# Patient Record
Sex: Female | Born: 1955 | Hispanic: Yes | Marital: Single | Smoking: Never smoker
Health system: Southern US, Community
[De-identification: ages and names within clinical notes are randomized; demographics above are authoritative.]

## PROBLEM LIST (undated history)

## (undated) DIAGNOSIS — I447 Left bundle-branch block, unspecified: Secondary | ICD-10-CM

## (undated) DIAGNOSIS — M810 Age-related osteoporosis without current pathological fracture: Secondary | ICD-10-CM

## (undated) DIAGNOSIS — E785 Hyperlipidemia, unspecified: Secondary | ICD-10-CM

## (undated) DIAGNOSIS — I10 Essential (primary) hypertension: Secondary | ICD-10-CM

## (undated) DIAGNOSIS — G4733 Obstructive sleep apnea (adult) (pediatric): Secondary | ICD-10-CM

## (undated) DIAGNOSIS — R7303 Prediabetes: Secondary | ICD-10-CM

## (undated) HISTORY — DX: Left bundle-branch block, unspecified: I44.7

## (undated) HISTORY — PX: COLONOSCOPY: SHX174

## (undated) HISTORY — DX: Obstructive sleep apnea (adult) (pediatric): G47.33

## (undated) HISTORY — PX: OTHER SURGICAL HISTORY: SHX169

## (undated) HISTORY — DX: Age-related osteoporosis without current pathological fracture: M81.0

---

## 2005-12-13 ENCOUNTER — Ambulatory Visit: Payer: Self-pay

## 2014-04-13 ENCOUNTER — Ambulatory Visit: Payer: Self-pay

## 2014-05-29 ENCOUNTER — Ambulatory Visit: Payer: Self-pay | Admitting: Gastroenterology

## 2014-06-01 LAB — PATHOLOGY REPORT

## 2015-01-08 ENCOUNTER — Emergency Department: Payer: Self-pay | Admitting: Emergency Medicine
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
## 2016-11-21 ENCOUNTER — Other Ambulatory Visit: Payer: Self-pay | Admitting: Internal Medicine

## 2016-11-21 DIAGNOSIS — Z1231 Encounter for screening mammogram for malignant neoplasm of breast: Secondary | ICD-10-CM

## 2016-12-20 ENCOUNTER — Ambulatory Visit
Admission: RE | Admit: 2016-12-20 | Discharge: 2016-12-20 | Disposition: A | Discharge status: Home / Self Care | Payer: BLUE CROSS/BLUE SHIELD | Source: Ambulatory Visit | Attending: Internal Medicine | Admitting: Internal Medicine

## 2016-12-20 ENCOUNTER — Encounter: Payer: Self-pay | Admitting: Radiology

## 2016-12-20 DIAGNOSIS — Z1231 Encounter for screening mammogram for malignant neoplasm of breast: Secondary | ICD-10-CM

## 2017-01-23 ENCOUNTER — Encounter: Payer: Self-pay | Admitting: Emergency Medicine

## 2017-01-23 ENCOUNTER — Emergency Department
Admission: EM | Admit: 2017-01-23 | Discharge: 2017-01-24 | Disposition: A | Discharge status: Home / Self Care | Payer: BLUE CROSS/BLUE SHIELD | Attending: Emergency Medicine | Admitting: Emergency Medicine

## 2017-01-23 DIAGNOSIS — J029 Acute pharyngitis, unspecified: Secondary | ICD-10-CM | POA: Diagnosis present

## 2017-01-23 DIAGNOSIS — I10 Essential (primary) hypertension: Secondary | ICD-10-CM | POA: Insufficient documentation

## 2017-01-23 DIAGNOSIS — J0100 Acute maxillary sinusitis, unspecified: Secondary | ICD-10-CM | POA: Insufficient documentation

## 2017-01-23 HISTORY — DX: Essential (primary) hypertension: I10

## 2017-01-24 LAB — POCT RAPID STREP A: Streptococcus, Group A Screen (Direct): NEGATIVE

## 2017-01-24 MED ORDER — AZITHROMYCIN 250 MG PO TABS: ORAL_TABLET | ORAL | 0 refills | Status: AC

## 2017-01-24 MED ORDER — AZITHROMYCIN 500 MG PO TABS
500.0000 mg | ORAL_TABLET | Freq: Once | ORAL | Status: AC
  Administered 2017-01-24: 500 mg via ORAL
  Filled 2017-01-24: qty 1

## 2017-01-24 MED ORDER — IBUPROFEN 800 MG PO TABS
800.0000 mg | ORAL_TABLET | Freq: Once | ORAL | Status: AC
  Administered 2017-01-24: 800 mg via ORAL
  Filled 2017-01-24: qty 1

## 2017-01-24 NOTE — ED Provider Notes (Signed)
[facility] Emergency Department Provider Note    First MD Initiated Contact with Patient 01/24/17 0011     (approximate)  I have reviewed the triage vital signs and the nursing notes.   HISTORY  Chief Complaint Sore Throat    HPI Carmen Singh is a 61 y.o. female with history of hypertension presents to the emergency department 1 day history of sore throat nasal congestion and subjective fevers. Patient admits to nonproductive cough.   Past Medical History:  Diagnosis Date  . Hypertension     There are no active problems to display for this patient.   History reviewed. No pertinent surgical history.  Prior to Admission medications   Medication Sig Start Date End Date Taking? Authorizing Provider  azithromycin (ZITHROMAX Z-PAK) 250 MG tablet Take 2 tablets (500 mg) on  Day 1,  followed by 1 tablet (250 mg) once daily on Days 2 through 5. 01/24/17 01/29/17  Darci Current, MD    Allergies Patient has no known allergies.  Family History  Problem Relation Age of Onset  . Uterine cancer Mother     Social History Social History  Substance Use Topics  . Smoking status: Never Smoker  . Smokeless tobacco: Never Used  . Alcohol use No    Review of Systems Constitutional: Positive for fever/chills Eyes: No visual changes. ENT: No sore throat.Positive for nasal congestion Cardiovascular: Denies chest pain. Respiratory: Denies shortness of breath. Positive for nonproductive cough Gastrointestinal: No abdominal pain.  No nausea, no vomiting.  No diarrhea.  No constipation. Genitourinary: Negative for dysuria. Musculoskeletal: Negative for back pain. Skin: Negative for rash. Neurological: Negative for headaches, focal weakness or numbness.  10-point ROS otherwise negative.  ____________________________________________   PHYSICAL EXAM:  VITAL SIGNS: ED Triage Vitals [01/23/17 2057]  Enc Vitals Group     BP (!) 169/86   Pulse Rate 98     Resp 18     Temp 98.6 F (37 C)     Temp Source Oral     SpO2 99 %     Weight 135 lb (61.2 kg)     Height 5' (1.524 m)     Head Circumference      Peak Flow      Pain Score 5     Pain Loc      Pain Edu?      Excl. in GC?     Constitutional: Alert and oriented. Well appearing and in no acute distress. Eyes: Conjunctivae are normal. PERRL. EOMI. Head: Atraumatic. Ears:  Healthy appearing ear canals and TM fullness clear fluid Nose: No congestion/rhinnorhea. Pain with maxillary sinus palpation left worse than right Mouth/Throat: Mucous membranes are moist.  Oropharynx non-erythematous. Neck: No stridor.  No meningeal signs. Cardiovascular: Normal rate, regular rhythm. Good peripheral circulation. Grossly normal heart sounds. Respiratory: Normal respiratory effort.  No retractions. Lungs CTAB. Gastrointestinal: Soft and nontender. No distention.  Musculoskeletal: No lower extremity tenderness nor edema. No gross deformities of extremities. Neurologic:  Normal speech and language. No gross focal neurologic deficits are appreciated.  Skin:  Skin is warm, dry and intact. No rash noted.   ____________________________________________   LABS (all labs ordered are listed, but only abnormal results are displayed)  Labs Reviewed  CULTURE, GROUP A STREP [facility])  POCT RAPID STREP A       Procedures   ____________________________________________   INITIAL IMPRESSION / ASSESSMENT AND PLAN / ED COURSE  Pertinent labs & imaging results that were available  during my care of the patient were reviewed by me and considered in my medical decision making (see chart for details).  History physical exam concern for possible sinusitis as such patient given azithromycin.      ____________________________________________  FINAL CLINICAL IMPRESSION(S) / ED DIAGNOSES  Final diagnoses:  Acute non-recurrent maxillary sinusitis     MEDICATIONS GIVEN DURING THIS  VISIT:  Medications  azithromycin (ZITHROMAX) tablet 500 mg (500 mg Oral Given 01/24/17 0026)  ibuprofen (ADVIL,MOTRIN) tablet 800 mg (800 mg Oral Given 01/24/17 0026)     NEW OUTPATIENT MEDICATIONS STARTED DURING THIS VISIT:  New Prescriptions   AZITHROMYCIN (ZITHROMAX Z-PAK) 250 MG TABLET    Take 2 tablets (500 mg) on  Day 1,  followed by 1 tablet (250 mg) once daily on Days 2 through 5.    Modified Medications   No medications on file    Discontinued Medications   No medications on file     Note:  This document was prepared using Dragon voice recognition software and may include unintentional dictation errors.    Darci Currentandolph N Brown, MD 01/24/17 [phone]

## 2017-01-26 LAB — CULTURE, GROUP A STREP (THRC)

## 2019-03-28 ENCOUNTER — Telehealth: Payer: Self-pay

## 2019-03-28 NOTE — Telephone Encounter (Signed)
Patient called our triage line and left a message to call her back. I have called her back twice and her voicemail is not set-up yet. Therefore, I have not been able to leave her a voicemail. Hopefully patient calls back.  I saw that patient was a self-referral to be seen at [facility] on 03/31/2019 but appointment was cancelled.

## 2019-03-28 NOTE — Telephone Encounter (Signed)
Sheena sent me a message letting me know that they are not seeing [facility] patients at this time due to COVID-19. They are only seeing Diagnostic patients at this time.

## 2019-03-31 ENCOUNTER — Ambulatory Visit: Payer: BLUE CROSS/BLUE SHIELD

## 2019-05-15 ENCOUNTER — Other Ambulatory Visit: Payer: Self-pay

## 2019-05-15 DIAGNOSIS — Z20822 Contact with and (suspected) exposure to covid-19: Secondary | ICD-10-CM

## 2019-05-19 LAB — NOVEL CORONAVIRUS, NAA: SARS-CoV-2, NAA: DETECTED — AB

## 2022-01-30 ENCOUNTER — Other Ambulatory Visit: Payer: Self-pay | Admitting: Family Medicine

## 2022-01-30 DIAGNOSIS — Z1231 Encounter for screening mammogram for malignant neoplasm of breast: Secondary | ICD-10-CM

## 2022-03-07 ENCOUNTER — Ambulatory Visit
Admission: RE | Admit: 2022-03-07 | Discharge: 2022-03-07 | Disposition: A | Discharge status: Home / Self Care | Payer: Commercial Managed Care - PPO | Source: Ambulatory Visit | Attending: Family Medicine | Admitting: Family Medicine

## 2022-03-07 DIAGNOSIS — Z1231 Encounter for screening mammogram for malignant neoplasm of breast: Secondary | ICD-10-CM | POA: Insufficient documentation

## 2022-06-12 ENCOUNTER — Ambulatory Visit (INDEPENDENT_AMBULATORY_CARE_PROVIDER_SITE_OTHER): Payer: BLUE CROSS/BLUE SHIELD | Admitting: Internal Medicine

## 2022-06-12 VITALS — BP 149/81 | HR 57 | Resp 14 | Ht 61.0 in | Wt 133.4 lb

## 2022-06-12 DIAGNOSIS — I10 Essential (primary) hypertension: Secondary | ICD-10-CM | POA: Diagnosis not present

## 2022-06-12 DIAGNOSIS — G4733 Obstructive sleep apnea (adult) (pediatric): Secondary | ICD-10-CM | POA: Diagnosis not present

## 2022-06-12 NOTE — Progress Notes (Signed)
Sleep Medicine   Office Visit  Patient Name: Carmen Singh DOB: 1955-12-20 MRN [id number]    Chief Complaint: sleep evaluation  Brief History:  Miroslava presents for an initial consult for sleep evaluation and to establish care. Sleep quality is poor. This is noted most nights. The patient's bed partner reports loud snoring and coughing at night. The patient relates the following symptoms: loud snoring, excessive daytime sleepiness, difficulty getting to sleep and staying asleep, and lack of focus are also present. The patient goes to sleep at 3 am and wakes up at 8:30 am. Sleep quality is better when outside home environment.  Patient has noted restlessness of her legs at night that will disrupt her sleep.  The patient  relates no unusual behavior during the night.  The patient relates  a history of psychiatric problems. The Epworth Sleepiness Score is 0 out of 24 .  The patient relates  Cardiovascular risk factors include: hypertension.      ROS  General: (-) fever, (-) chills, (-) night sweat Nose and Sinuses: (-) nasal stuffiness or itchiness, (-) postnasal drip, (-) nosebleeds, (-) sinus trouble. Mouth and Throat: (-) sore throat, (-) hoarseness. Neck: (-) swollen glands, (-) enlarged thyroid, (-) neck pain. Respiratory: - cough, - shortness of breath, - wheezing. Neurologic: - numbness, - tingling. Psychiatric: + anxiety, + depression Sleep behavior: -sleep paralysis -hypnogogic hallucinations -dream enactment      -vivid dreams -cataplexy -night terrors -sleep walking   Current Medication: Outpatient Encounter Medications as of 06/12/2022  Medication Sig   albuterol (VENTOLIN HFA) 108 (90 Base) MCG/ACT inhaler Inhale into the lungs.   atorvastatin (LIPITOR) 20 MG tablet Take 20 mg by mouth daily.   cetirizine (ZYRTEC) 5 MG tablet Take 5 mg by mouth daily.   fluticasone (FLONASE) 50 MCG/ACT nasal spray Place into both nostrils.   LEXAPRO 5 MG tablet Take 5 mg by mouth  daily.   lisinopril (ZESTRIL) 20 MG tablet Take 20 mg by mouth daily.   melatonin 3 MG TABS tablet Take 3-6 mg by mouth at bedtime.   traZODone (DESYREL) 50 MG tablet Take 50 mg by mouth at bedtime.   No facility-administered encounter medications on file as of 06/12/2022.    Surgical History: History reviewed. No pertinent surgical history.  Medical History: Past Medical History:  Diagnosis Date   Hypertension     Family History: Non contributory to the present illness  Social History: Social History   Socioeconomic History   Marital status: Single    Spouse name: Not on file   Number of children: Not on file   Years of education: Not on file   Highest education level: Not on file  Occupational History   Not on file  Tobacco Use   Smoking status: Never   Smokeless tobacco: Never  Substance and Sexual Activity   Alcohol use: No   Drug use: Not on file   Sexual activity: Not on file  Other Topics Concern   Not on file  Social History Narrative   Not on file   Social Determinants of Health   Financial Resource Strain: Not on file  Food Insecurity: Not on file  Transportation Needs: Not on file  Physical Activity: Not on file  Stress: Not on file  Social Connections: Not on file  Intimate Partner Violence: Not on file    Vital Signs: Blood pressure (!) 149/81, pulse (!) 57, resp. rate 14, height 5\' 1"  (1.549 m), weight 133 lb 6.4 oz (  60.5 kg), SpO2 98 %. Body mass index is 25.21 kg/m.   Examination: General Appearance: The patient is well-developed, well-nourished, and in no distress. Neck Circumference: 45 cm Skin: Gross inspection of skin unremarkable. Head: normocephalic, no gross deformities. Eyes: no gross deformities noted. ENT: ears appear grossly normal Neurologic: Alert and oriented. No involuntary movements.    STOP BANG RISK ASSESSMENT S (snore) Have you been told that you snore?     YES   T (tired) Are you often tired, fatigued, or  sleepy during the day?   YES  O (obstruction) Do you stop breathing, choke, or gasp during sleep? YES   P (pressure) Do you have or are you being treated for high blood pressure? YES   B (BMI) Is your body index greater than 35 kg/m? NO   A (age) Are you 35 years old or older? YES   N (neck) Do you have a neck circumference greater than 16 inches?   YES   G (gender) Are you a female? NO   TOTAL STOP/BANG "YES" ANSWERS 6                                                               A STOP-Bang score of 2 or less is considered low risk, and a score of 5 or more is high risk for having either moderate or severe OSA. For people who score 3 or 4, doctors may need to perform further assessment to determine how likely they are to have OSA.         EPWORTH SLEEPINESS SCALE:  Scale:  (0)= no chance of dozing; (1)= slight chance of dozing; (2)= moderate chance of dozing; (3)= high chance of dozing  Chance  Situtation    Sitting and reading: 0    Watching TV: 0    Sitting Inactive in public: 0    As a passenger in car: 0      Lying down to rest: 0    Sitting and talking: 0    Sitting quielty after lunch: 0    In a car, stopped in traffic: 0   TOTAL SCORE:   0 out of 24    SLEEP STUDIES:  None   LABS: No results found for this or any previous visit (from the past 2160 hour(s)).  Radiology: MM 3D SCREEN BREAST BILATERAL  Result Date: 03/07/2022 CLINICAL DATA:  Screening. EXAM: DIGITAL SCREENING BILATERAL MAMMOGRAM WITH TOMOSYNTHESIS AND CAD TECHNIQUE: Bilateral screening digital craniocaudal and mediolateral oblique mammograms were obtained. Bilateral screening digital breast tomosynthesis was performed. The images were evaluated with computer-aided detection. COMPARISON:  Previous exam(s). ACR Breast Density Category c: The breast tissue is heterogeneously dense, which may obscure small masses. FINDINGS: There are no findings suspicious for malignancy. IMPRESSION: No  mammographic evidence of malignancy. A result letter of this screening mammogram will be mailed directly to the patient. RECOMMENDATION: Screening mammogram in one year. (Code:SM-B-01Y) BI-RADS CATEGORY  1: Negative. Electronically Signed   By: Edwin Cap M.D.   On: 03/07/2022 15:31    No results found.  No results found.    Assessment and Plan: Patient Active Problem List   Diagnosis Date Noted   OSA (obstructive sleep apnea) 06/12/2022   Hypertension 06/12/2022    1. OSA (obstructive  sleep apnea) PLAN OSA:   Patient evaluation suggests high risk of sleep disordered breathing due to snoring, and coughing at night, difficulty falling and staying asleep.  Patient has comorbid cardiovascular risk factors including: hypertension which could be exacerbated by pathologic sleep-disordered breathing.  Suggest: PSG to assess the patient's sleep disordered breathing. The patient was also counselled on weight loss to optimize sleep health.    2. Hypertension, unspecified type Hypertension Counseling:   The following hypertensive lifestyle modification were recommended and discussed:  1. Limiting alcohol intake to less than 1 oz/day of ethanol:(24 oz of beer or 8 oz of wine or 2 oz of 100-proof whiskey). 2. Take baby ASA 81 mg daily. 3. Importance of regular aerobic exercise and losing weight. 4. Reduce dietary saturated fat and cholesterol intake for overall cardiovascular health. 5. Maintaining adequate dietary potassium, calcium, and magnesium intake. 6. Regular monitoring of the blood pressure. 7. Reduce sodium intake to less than 100 mmol/day (less than 2.3 gm of sodium or less than 6 gm of sodium choride)     General Counseling: I have discussed the findings of the evaluation and examination with Malachi Bonds.  I have also discussed any further diagnostic evaluation thatmay be needed or ordered today. Anaya verbalizes understanding of the findings of todays visit. We also reviewed her  medications today and discussed drug interactions and side effects including but not limited excessive drowsiness and altered mental states. We also discussed that there is always a risk not just to her but also people around her. she has been encouraged to call the office with any questions or concerns that should arise related to todays visit.  No orders of the defined types were placed in this encounter.       I have personally obtained a history, evaluated the patient, evaluated pertinent data, formulated the assessment and plan and placed orders.   This patient was seen today by Emmaline Kluver, PA-C in collaboration with Dr. Freda Munro.   Yevonne Pax, MD [facility] Diplomate ABMS Pulmonary and Critical Care Medicine Sleep medicine

## 2022-08-31 ENCOUNTER — Emergency Department
Admission: EM | Admit: 2022-08-31 | Discharge: 2022-09-01 | Disposition: A | Discharge status: Home / Self Care | Payer: Worker's Compensation | Attending: Emergency Medicine | Admitting: Emergency Medicine

## 2022-08-31 ENCOUNTER — Other Ambulatory Visit: Payer: Self-pay

## 2022-08-31 ENCOUNTER — Encounter: Payer: Self-pay | Admitting: Emergency Medicine

## 2022-08-31 ENCOUNTER — Emergency Department: Payer: Worker's Compensation

## 2022-08-31 DIAGNOSIS — Y990 Civilian activity done for income or pay: Secondary | ICD-10-CM | POA: Diagnosis not present

## 2022-08-31 DIAGNOSIS — S5001XA Contusion of right elbow, initial encounter: Secondary | ICD-10-CM | POA: Diagnosis not present

## 2022-08-31 DIAGNOSIS — S60221A Contusion of right hand, initial encounter: Secondary | ICD-10-CM | POA: Insufficient documentation

## 2022-08-31 DIAGNOSIS — I10 Essential (primary) hypertension: Secondary | ICD-10-CM | POA: Insufficient documentation

## 2022-08-31 DIAGNOSIS — W19XXXA Unspecified fall, initial encounter: Secondary | ICD-10-CM

## 2022-08-31 DIAGNOSIS — W1839XA Other fall on same level, initial encounter: Secondary | ICD-10-CM | POA: Diagnosis not present

## 2022-08-31 DIAGNOSIS — S59901A Unspecified injury of right elbow, initial encounter: Secondary | ICD-10-CM | POA: Diagnosis present

## 2022-08-31 NOTE — ED Notes (Signed)
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

## 2022-08-31 NOTE — ED Triage Notes (Signed)
Pt to ED from work for fall tonight.  States was putting box up on shelf and slipped.  Pain to right hand and elbow.  No obvious deformity, CMS intact.  Works for [employer].

## 2022-08-31 NOTE — ED Notes (Signed)
Pt here with supervisor.  Paperwork states need drug testing.

## 2022-09-01 NOTE — ED Provider Notes (Signed)
[facility] Provider Note    Event Date/Time   First MD Initiated Contact with Patient 08/31/22 2348     (approximate)   History   Fall   HPI  Carmen Singh is a 66 y.o. female with history of hypertension presents emergency department after a fall at work.  Patient had stepped up limited illogic to reach something higher when she fell landing on her right elbow and right hand.  No head injury.  No neck pain or back pain.  No other complaints.      Physical Exam   Triage Vital Signs: ED Triage Vitals  Enc Vitals Group     BP 08/31/22 2138 (!) 170/62     Pulse Rate 08/31/22 2138 66     Resp 08/31/22 2138 16     Temp 08/31/22 2138 98.3 F (36.8 C)     Temp Source 08/31/22 2138 Oral     SpO2 08/31/22 2138 100 %     Weight 08/31/22 2139 138 lb (62.6 kg)     Height 08/31/22 2139 5\' 1"  (1.549 m)     Head Circumference --      Peak Flow --      Pain Score 08/31/22 2154 6     Pain Loc --      Pain Edu? --      Excl. in GC? --     Most recent vital signs: Vitals:   08/31/22 2138  BP: (!) 170/62  Pulse: 66  Resp: 16  Temp: 98.3 F (36.8 C)  SpO2: 100%     General: Awake, no distress.   CV:  Good peripheral perfusion. regular rate and  rhythm Resp:  Normal effort.  Abd:  No distention.   Other:  Right elbow tender and bruised at the olecranon, right hand is tender at the thenar muscle, full range of motion, neurovascular intact, spine is nontender, no other bony tenderness appreciated in the left extremity and lower extremities   ED Results / Procedures / Treatments   Labs (all labs ordered are listed, but only abnormal results are displayed) Labs Reviewed - No data to display   EKG     RADIOLOGY Stray of the right elbow, x-ray of the right hand    PROCEDURES:   Procedures   MEDICATIONS ORDERED IN ED: Medications - No data to display   IMPRESSION / MDM / ASSESSMENT AND PLAN / ED COURSE  I reviewed the  triage vital signs and the nursing notes.                              Differential diagnosis includes, but is not limited to, contusion, sprain, fracture  Patient's presentation is most consistent with acute complicated illness / injury requiring diagnostic workup.   Physical exam this is multiple contusions, X-ray of right elbow was independently reviewed and interpreted by me as being negative, x-ray of the right hand was independently reviewed and interpreted by me as being negative  I did explain the findings to the patient.  She was placed in a sling for comfort.  She is to follow-up with orthopedics if not improving 1 week.  Return emergency department if worsening.  Patient is in agreement treatment plan.  Discharged stable condition.  She is to take over-the-counter ibuprofen for pain if needed.  Worker's Comp. instructions were no use of the right arm for 4 days.  This was sent in  English and in Spanish for the patient.      FINAL CLINICAL IMPRESSION(S) / ED DIAGNOSES   Final diagnoses:  Fall, initial encounter  Contusion of right elbow, initial encounter  Contusion of right hand, initial encounter     Rx / DC Orders   ED Discharge Orders     None        Note:  This document was prepared using Dragon voice recognition software and may include unintentional dictation errors.    Versie Starks, PA-C 09/01/22 0006    Harvest Dark, MD 09/05/22 1459

## 2023-01-16 ENCOUNTER — Other Ambulatory Visit: Payer: Self-pay | Admitting: Family Medicine

## 2023-01-16 DIAGNOSIS — Z1231 Encounter for screening mammogram for malignant neoplasm of breast: Secondary | ICD-10-CM

## 2023-02-15 ENCOUNTER — Encounter: Payer: Self-pay | Admitting: *Deleted

## 2023-02-16 ENCOUNTER — Ambulatory Visit: Payer: BLUE CROSS/BLUE SHIELD | Admitting: Anesthesiology

## 2023-02-16 ENCOUNTER — Encounter
Admission: RE | Disposition: A | Discharge status: Home / Self Care | Payer: Self-pay | Source: Home / Self Care | Attending: Gastroenterology

## 2023-02-16 ENCOUNTER — Other Ambulatory Visit: Payer: Self-pay

## 2023-02-16 ENCOUNTER — Ambulatory Visit
Admission: RE | Admit: 2023-02-16 | Discharge: 2023-02-16 | Disposition: A | Discharge status: Home / Self Care | Payer: BLUE CROSS/BLUE SHIELD | Attending: Gastroenterology | Admitting: Gastroenterology

## 2023-02-16 ENCOUNTER — Encounter: Payer: Self-pay | Admitting: *Deleted

## 2023-02-16 DIAGNOSIS — K640 First degree hemorrhoids: Secondary | ICD-10-CM | POA: Insufficient documentation

## 2023-02-16 DIAGNOSIS — Z1211 Encounter for screening for malignant neoplasm of colon: Secondary | ICD-10-CM | POA: Insufficient documentation

## 2023-02-16 DIAGNOSIS — K5730 Diverticulosis of large intestine without perforation or abscess without bleeding: Secondary | ICD-10-CM | POA: Diagnosis not present

## 2023-02-16 DIAGNOSIS — E785 Hyperlipidemia, unspecified: Secondary | ICD-10-CM | POA: Insufficient documentation

## 2023-02-16 DIAGNOSIS — Z86010 Personal history of colonic polyps: Secondary | ICD-10-CM | POA: Diagnosis not present

## 2023-02-16 DIAGNOSIS — I10 Essential (primary) hypertension: Secondary | ICD-10-CM | POA: Insufficient documentation

## 2023-02-16 HISTORY — DX: Prediabetes: R73.03

## 2023-02-16 HISTORY — DX: Hyperlipidemia, unspecified: E78.5

## 2023-02-16 HISTORY — PX: COLONOSCOPY WITH PROPOFOL: SHX5780

## 2023-02-16 SURGERY — COLONOSCOPY WITH PROPOFOL
Anesthesia: General

## 2023-02-16 MED ORDER — PROPOFOL 500 MG/50ML IV EMUL
INTRAVENOUS | Status: DC | PRN
  Administered 2023-02-16: 150 ug/kg/min via INTRAVENOUS

## 2023-02-16 MED ORDER — LIDOCAINE HCL (CARDIAC) PF 100 MG/5ML IV SOSY
PREFILLED_SYRINGE | INTRAVENOUS | Status: DC | PRN
  Administered 2023-02-16: 50 mg via INTRAVENOUS

## 2023-02-16 MED ORDER — SODIUM CHLORIDE 0.9 % IV SOLN: INTRAVENOUS | Status: DC

## 2023-02-16 MED ORDER — PROPOFOL 10 MG/ML IV BOLUS
INTRAVENOUS | Status: DC | PRN
  Administered 2023-02-16: 20 mg via INTRAVENOUS
  Administered 2023-02-16: 60 mg via INTRAVENOUS

## 2023-02-16 MED ORDER — GLYCOPYRROLATE 0.2 MG/ML IJ SOLN
INTRAMUSCULAR | Status: DC | PRN
  Administered 2023-02-16: .2 mg via INTRAVENOUS

## 2023-02-16 MED ORDER — GLYCOPYRROLATE 0.2 MG/ML IJ SOLN
INTRAMUSCULAR | Status: AC
  Filled 2023-02-16: qty 1

## 2023-02-16 NOTE — Transfer of Care (Signed)
Immediate Anesthesia Transfer of Care Note  Patient: Doriann Zuch Miranda  Procedure(s) Performed: COLONOSCOPY WITH PROPOFOL  Patient Location: Endoscopy Unit  Anesthesia Type:General  Level of Consciousness: drowsy  Airway & Oxygen Therapy: Patient Spontanous Breathing  Post-op Assessment: Report given to RN and Post -op Vital signs reviewed and stable  Post vital signs: Reviewed and stable  Last Vitals:  Vitals Value Taken Time  BP 103/51 02/16/23 1526  Temp    Pulse 69 02/16/23 1527  Resp 11 02/16/23 1527  SpO2 99 % 02/16/23 1527  Vitals shown include unvalidated device data.  Last Pain:  Vitals:   02/16/23 1446  TempSrc: Temporal  PainSc: 0-No pain         Complications: No notable events documented.

## 2023-02-16 NOTE — Anesthesia Postprocedure Evaluation (Signed)
Anesthesia Post Note  Patient: Carmen Singh  Procedure(s) Performed: COLONOSCOPY WITH PROPOFOL  Patient location during evaluation: Endoscopy Anesthesia Type: General Level of consciousness: awake and alert Pain management: pain level controlled Vital Signs Assessment: post-procedure vital signs reviewed and stable Respiratory status: spontaneous breathing, nonlabored ventilation, respiratory function stable and patient connected to nasal cannula oxygen Cardiovascular status: blood pressure returned to baseline and stable Postop Assessment: no apparent nausea or vomiting Anesthetic complications: no   No notable events documented.   Last Vitals:  Vitals:   02/16/23 1526 02/16/23 1543  BP: (!) 103/51 130/63  Pulse:    Resp:    Temp: (!) 36.3 C   SpO2:      Last Pain:  Vitals:   02/16/23 1543  TempSrc:   PainSc: 0-No pain                 Louie Boston

## 2023-02-16 NOTE — Anesthesia Preprocedure Evaluation (Signed)
Anesthesia Evaluation  Patient identified by MRN, date of birth, ID band Patient awake    Reviewed: Allergy & Precautions, H&P , NPO status , Patient's Chart, lab work & pertinent test results, reviewed documented beta blocker date and time   History of Anesthesia Complications Negative for: history of anesthetic complications  Airway Mallampati: II  TM Distance: >3 FB Neck ROM: full    Dental  (+) Dental Advidsory Given, Upper Dentures, Lower Dentures   Pulmonary neg pulmonary ROS, neg shortness of breath, neg COPD, neg recent URI   Pulmonary exam normal breath sounds clear to auscultation       Cardiovascular Exercise Tolerance: Good hypertension, (-) angina (-) Past MI and (-) Cardiac Stents Normal cardiovascular exam(-) dysrhythmias (-) Valvular Problems/Murmurs Rhythm:regular Rate:Normal     Neuro/Psych negative neurological ROS  negative psych ROS   GI/Hepatic negative GI ROS, Neg liver ROS,,,  Endo/Other  diabetes (borderline)    Renal/GU negative Renal ROS  negative genitourinary   Musculoskeletal   Abdominal   Peds  Hematology negative hematology ROS (+)   Anesthesia Other Findings Past Medical History: No date: HLD (hyperlipidemia) No date: Hypertension No date: Pre-diabetes   Reproductive/Obstetrics negative OB ROS                             Anesthesia Physical Anesthesia Plan  ASA: 2  Anesthesia Plan: General   Post-op Pain Management:    Induction: Intravenous  PONV Risk Score and Plan: 3 and Propofol infusion and TIVA  Airway Management Planned: Natural Airway and Nasal Cannula  Additional Equipment:   Intra-op Plan:   Post-operative Plan:   Informed Consent: I have reviewed the patients History and Physical, chart, labs and discussed the procedure including the risks, benefits and alternatives for the proposed anesthesia with the patient or authorized  representative who has indicated his/her understanding and acceptance.     Dental Advisory Given  Plan Discussed with: Anesthesiologist, CRNA and Surgeon  Anesthesia Plan Comments:         Anesthesia Quick Evaluation

## 2023-02-16 NOTE — H&P (Signed)
Outpatient short stay form Pre-procedure 02/16/2023  Regis Bill, MD  Primary Physician: Hyman Hopes, MD  Reason for visit:  Surveillance colonoscopy  History of present illness:    67 y/o lady with history of hypertension and hyperlipidemia here for surveillance colonoscopy. Last colonoscopy in 2015 with adenomatous polyp removed. No blood thinners. No family history of GI malignancies. No significant abdominal surgeries.    Current Facility-Administered Medications:    0.9 %  sodium chloride infusion, , Intravenous, Continuous, Elie Leppo, Rossie Muskrat, MD  Medications Prior to Admission  Medication Sig Dispense Refill Last Dose   atorvastatin (LIPITOR) 20 MG tablet Take 20 mg by mouth daily.   02/15/2023   LEXAPRO 5 MG tablet Take 5 mg by mouth daily.   02/15/2023   lisinopril (ZESTRIL) 20 MG tablet Take 20 mg by mouth daily.   02/16/2023   albuterol (VENTOLIN HFA) 108 (90 Base) MCG/ACT inhaler Inhale into the lungs.      cetirizine (ZYRTEC) 5 MG tablet Take 5 mg by mouth daily.      fluticasone (FLONASE) 50 MCG/ACT nasal spray Place into both nostrils.      melatonin 3 MG TABS tablet Take 3-6 mg by mouth at bedtime.      traZODone (DESYREL) 50 MG tablet Take 50 mg by mouth at bedtime.        No Known Allergies   Past Medical History:  Diagnosis Date   HLD (hyperlipidemia)    Hypertension    Pre-diabetes     Review of systems:  Otherwise negative.    Physical Exam  Gen: Alert, oriented. Appears stated age.  HEENT: PERRLA. Lungs: No respiratory distress CV: RRR Abd: soft, benign, no masses Ext: No edema    Planned procedures: Proceed with colonoscopy. The patient understands the nature of the planned procedure, indications, risks, alternatives and potential complications including but not limited to bleeding, infection, perforation, damage to internal organs and possible oversedation/side effects from anesthesia. The patient agrees and gives consent to  proceed.  Please refer to procedure notes for findings, recommendations and patient disposition/instructions.     Regis Bill, MD [facility]

## 2023-02-16 NOTE — Op Note (Addendum)
[facility] Patient Name: Carmen Singh Procedure Date: 02/16/2023 2:49 PM MRN: [id number] Account #: [id number] Date of Birth: Jul 27, 1956 Admit Type: Outpatient Age: 67 Room: [facility] ENDO ROOM 3 Gender: Female Note Status: Finalized Instrument Name: Prentice Docker [id number] Procedure:             Colonoscopy Indications:           Surveillance: Personal history of adenomatous polyps                         on last colonoscopy > 5 years ago Providers:             Eather Colas MD, MD Medicines:             Monitored Anesthesia Care Complications:         No immediate complications. Procedure:             Pre-Anesthesia Assessment:                        - Prior to the procedure, a History and Physical was                         performed, and patient medications and allergies were                         reviewed. The patient is competent. The risks and                         benefits of the procedure and the sedation options and                         risks were discussed with the patient. All questions                         were answered and informed consent was obtained.                         Patient identification and proposed procedure were                         verified by the physician, the nurse, the                         anesthesiologist, the anesthetist and the technician                         in the endoscopy suite. Mental Status Examination:                         alert and oriented. Airway Examination: normal                         oropharyngeal airway and neck mobility. Respiratory                         Examination: clear to auscultation. CV Examination:                         normal. Prophylactic Antibiotics: The patient does not  require prophylactic antibiotics. Prior                         Anticoagulants: The patient has taken no anticoagulant                         or antiplatelet  agents. ASA Grade Assessment: II - A                         patient with mild systemic disease. After reviewing                         the risks and benefits, the patient was deemed in                         satisfactory condition to undergo the procedure. The                         anesthesia plan was to use monitored anesthesia care                         (MAC). Immediately prior to administration of                         medications, the patient was re-assessed for adequacy                         to receive sedatives. The heart rate, respiratory                         rate, oxygen saturations, blood pressure, adequacy of                         pulmonary ventilation, and response to care were                         monitored throughout the procedure. The physical                         status of the patient was re-assessed after the                         procedure.                        After obtaining informed consent, the colonoscope was                         passed under direct vision. Throughout the procedure,                         the patient's blood pressure, pulse, and oxygen                         saturations were monitored continuously. The                         Colonoscope was introduced through the anus and  advanced to the the terminal ileum. The colonoscopy                         was somewhat difficult due to restricted mobility of                         the colon. Successful completion of the procedure was                         aided by withdrawing the scope and replacing with the                         pediatric colonoscope. The patient tolerated the                         procedure well. The quality of the bowel preparation                         was good. The terminal ileum, ileocecal valve,                         appendiceal orifice, and rectum were photographed. Findings:      The perianal and digital rectal  examinations were normal.      The terminal ileum appeared normal.      A few small-mouthed diverticula were found in the sigmoid colon and       descending colon.      Internal hemorrhoids were found during retroflexion. The hemorrhoids       were Grade I (internal hemorrhoids that do not prolapse).      The exam was otherwise without abnormality on direct and retroflexion       views. Impression:            - The examined portion of the ileum was normal.                        - Diverticulosis in the sigmoid colon and in the                         descending colon.                        - Internal hemorrhoids.                        - The examination was otherwise normal on direct and                         retroflexion views.                        - No specimens collected. Recommendation:        - Discharge patient to home.                        - Resume previous diet.                        - Continue present medications.                        -  Repeat colonoscopy in 10 years for surveillance.                        - Return to referring physician as previously                         scheduled. Procedure Code(s):     --- Professional ---                        Z6109, Colorectal cancer screening; colonoscopy on                         individual at high risk Diagnosis Code(s):     --- Professional ---                        Z86.010, Personal history of colonic polyps                        K64.0, First degree hemorrhoids                        K57.30, Diverticulosis of large intestine without                         perforation or abscess without bleeding CPT copyright 2022 American Medical Association. All rights reserved. The codes documented in this report are preliminary and upon coder review may  be revised to meet current compliance requirements. Eather Colas MD, MD 02/16/2023 3:26:53 PM Number of Addenda: 0 Note Initiated On: 02/16/2023 2:49 PM Scope Withdrawal  Time: 0 hours 6 minutes 43 seconds  Total Procedure Duration: 0 hours 16 minutes 39 seconds  Estimated Blood Loss:  Estimated blood loss: none.      [facility]

## 2023-02-16 NOTE — Interval H&P Note (Signed)
History and Physical Interval Note:  02/16/2023 2:56 PM  [facility]  has presented today for surgery, with the diagnosis of personal history colon polyps.  The various methods of treatment have been discussed with the patient and family. After consideration of risks, benefits and other options for treatment, the patient has consented to  Procedure(s) with comments: COLONOSCOPY WITH PROPOFOL (N/A) - SPANISH INTERPRETER as a surgical intervention.  The patient's history has been reviewed, patient examined, no change in status, stable for surgery.  I have reviewed the patient's chart and labs.  Questions were answered to the patient's satisfaction.     Regis Bill  Ok to proceed with colonoscopy

## 2023-02-19 ENCOUNTER — Encounter: Payer: Self-pay | Admitting: Gastroenterology

## 2023-03-09 ENCOUNTER — Ambulatory Visit
Admission: RE | Admit: 2023-03-09 | Discharge: 2023-03-09 | Disposition: A | Discharge status: Home / Self Care | Payer: BLUE CROSS/BLUE SHIELD | Source: Ambulatory Visit | Attending: Family Medicine | Admitting: Family Medicine

## 2023-03-09 DIAGNOSIS — Z1231 Encounter for screening mammogram for malignant neoplasm of breast: Secondary | ICD-10-CM | POA: Insufficient documentation

## 2023-04-04 IMAGING — MG MM DIGITAL SCREENING BILAT W/ TOMO AND CAD
8 series · 9 of 24 positions shown · non-contrast
Comparison: Previous exam(s).

CLINICAL DATA: Screening.

EXAM:
DIGITAL SCREENING BILATERAL MAMMOGRAM WITH TOMOSYNTHESIS AND CAD
TECHNIQUE: Bilateral screening digital craniocaudal and mediolateral oblique
mammograms were obtained. Bilateral screening digital breast
tomosynthesis was performed. The images were evaluated with
computer-aided detection.

[L CC synth-2D]
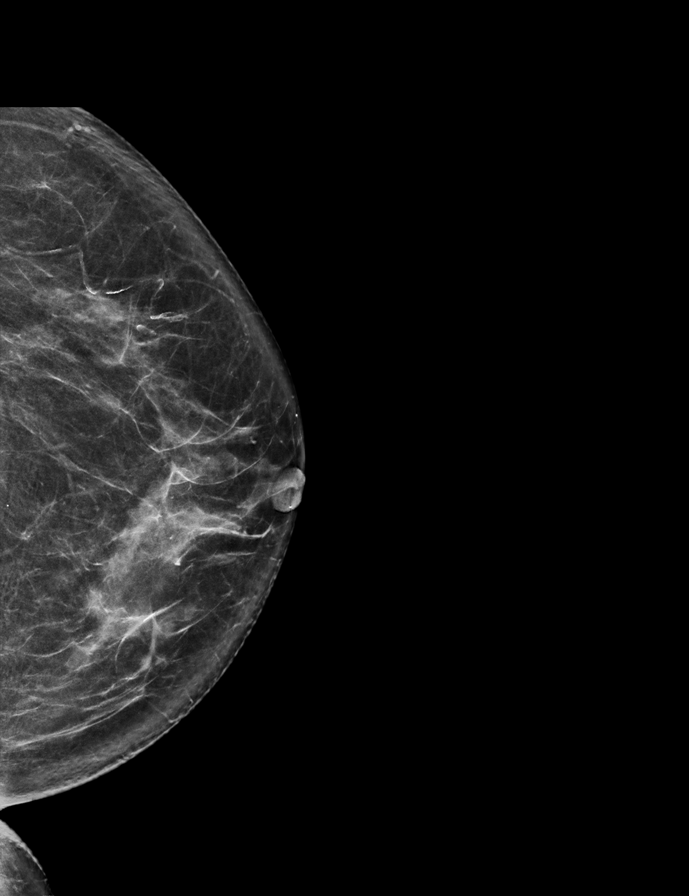

[R MLO synth-2D]
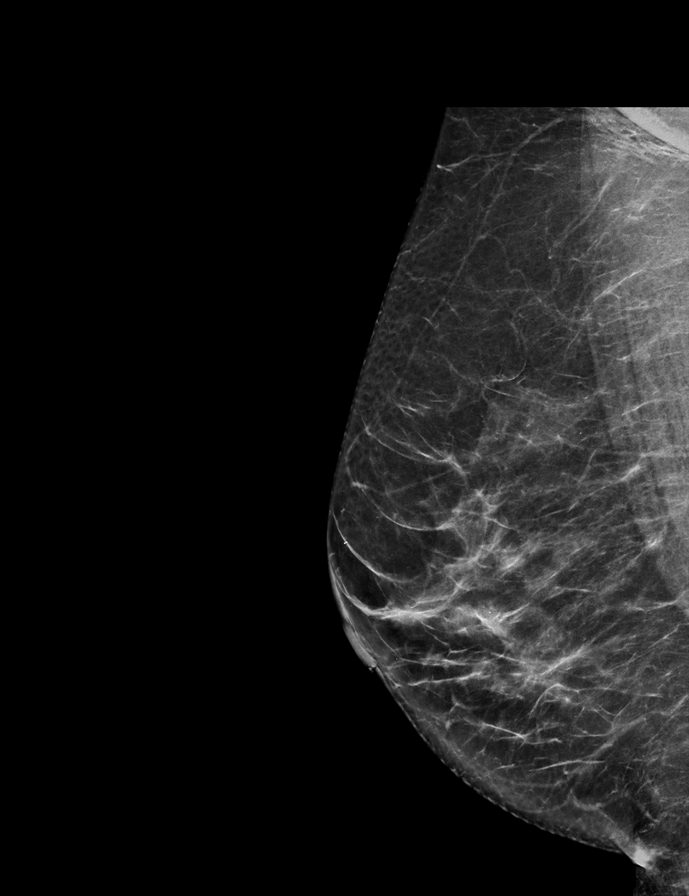

[R CC synth-2D]
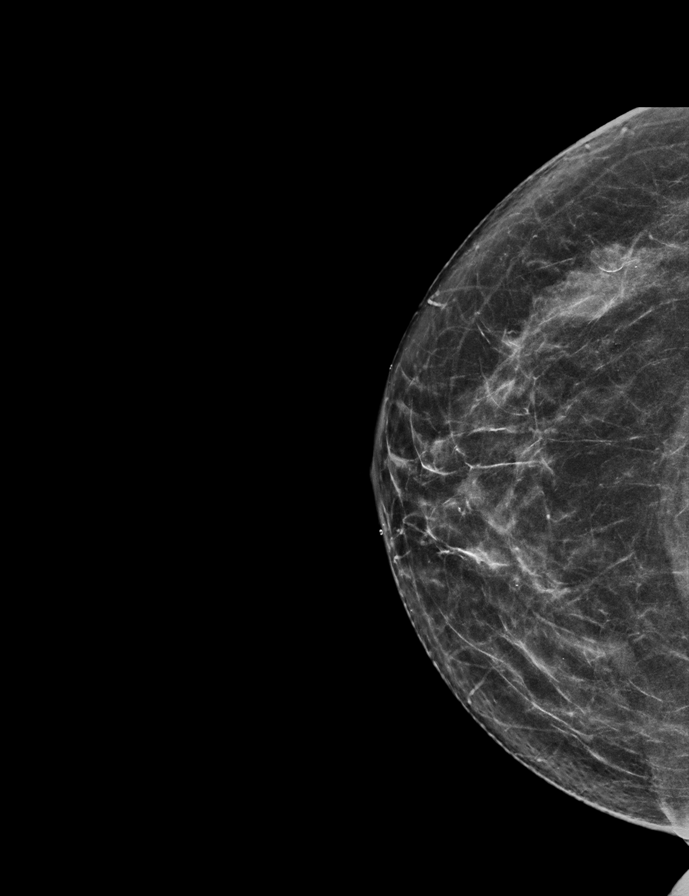

[L MLO synth-2D]
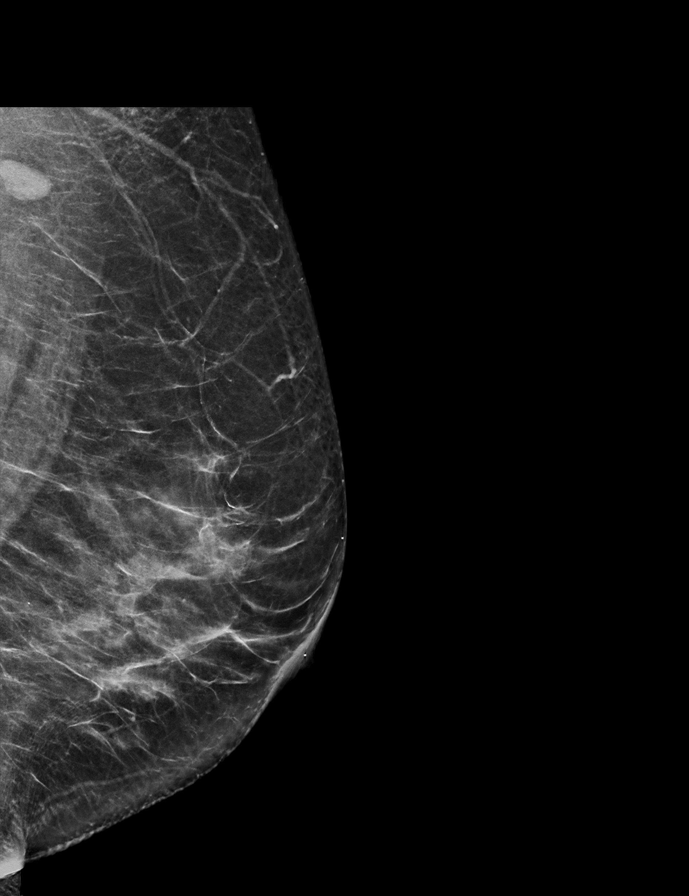

[L CC tomo · 2 of 68 frames shown]
[frame 22/68]
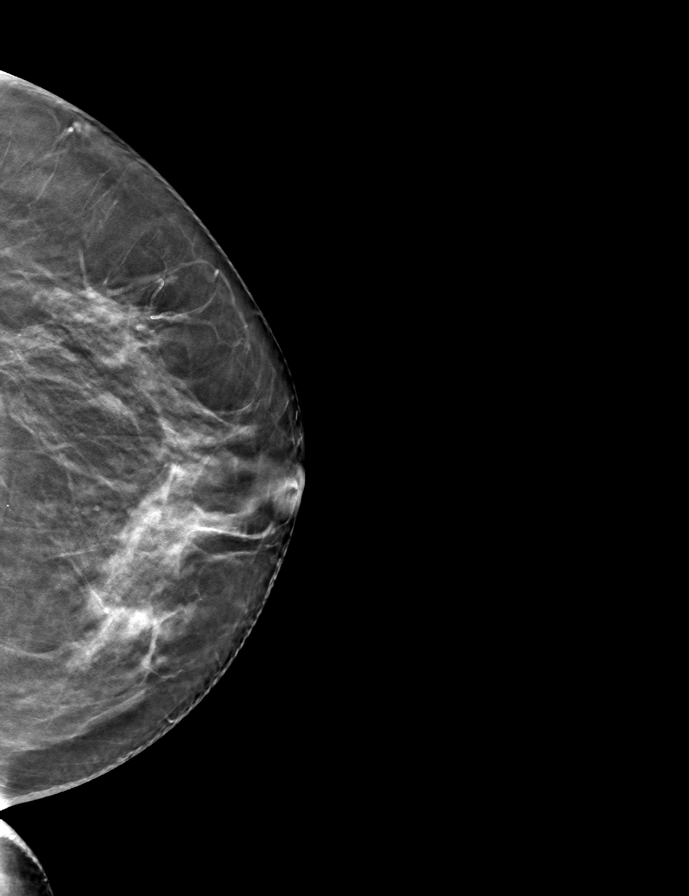
[frame 35/68]
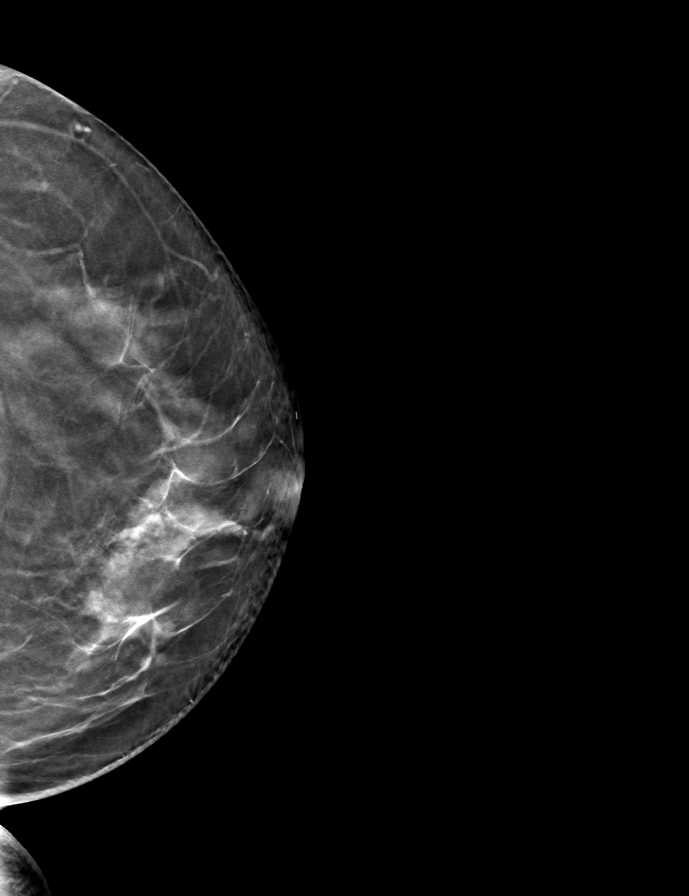

[R CC tomo · tomo slice 33/64.0]
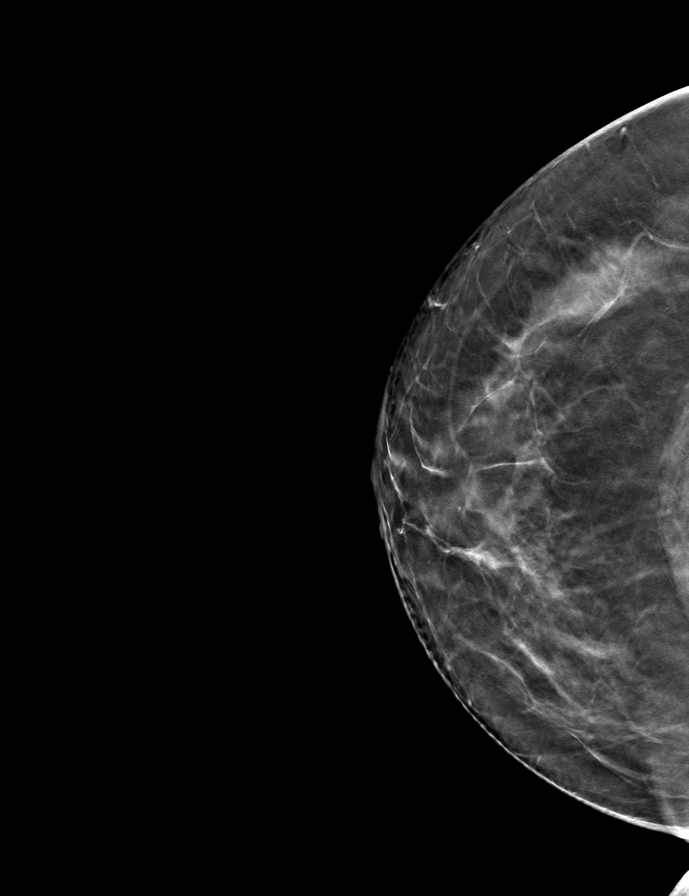

[R MLO tomo · tomo slice 35/70.0]
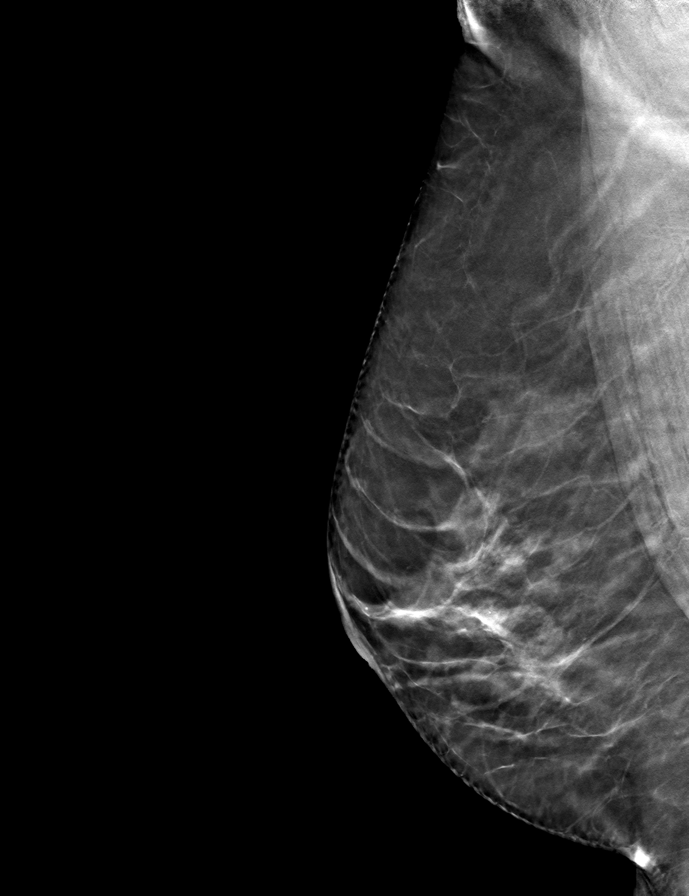

[L MLO tomo · tomo slice 34/67.0]
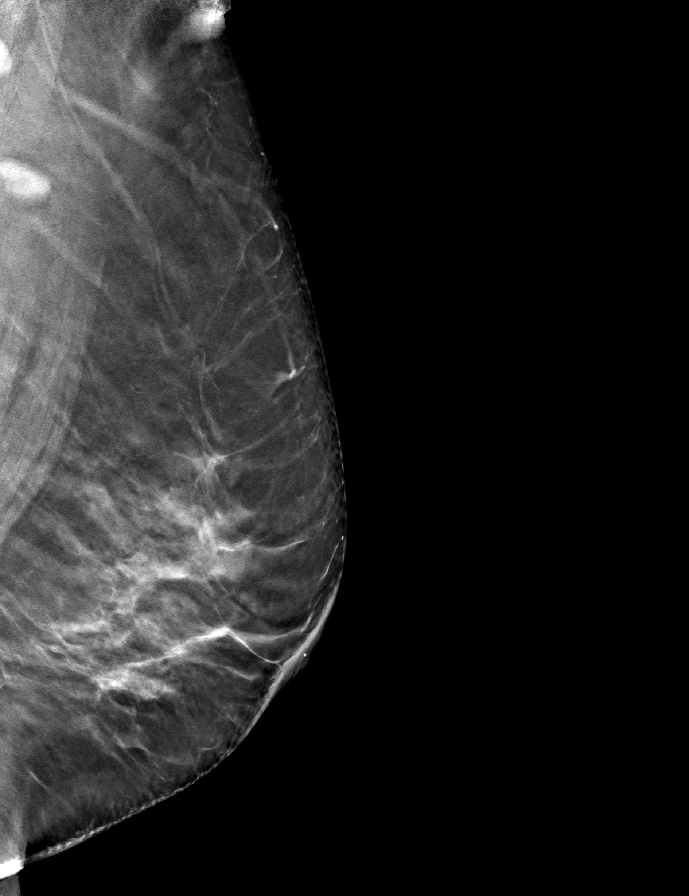

[9 of 24 positions shown; findings below may reference images not displayed]

ACR Breast Density Category c: The breast tissue is heterogeneously
dense, which may obscure small masses.
FINDINGS: There are no findings suspicious for malignancy.
IMPRESSION: No mammographic evidence of malignancy. A result letter of this
screening mammogram will be mailed directly to the patient.

RECOMMENDATION:
Screening mammogram in one year. (Code:Q3-W-BC3)

BI-RADS CATEGORY  1: Negative.

## 2024-04-20 NOTE — Progress Notes (Signed)
 History of Present Illness:   Carmen Singh is a 68 y.o. female here for infected poison ivy all over her arms bilaterally.  Of note, patient is Spanish-speaking only, but husband is translating for her.  He is also helping to provide the history.  He states that she has had symptoms for the last 3 weeks.  She does work in her garden.  She has an itchy rash over her arms bilaterally.  She has been using calamine lotion.  She also has some redness and swelling of her cheeks bilaterally.  She denies any headache or vision changes.  She denies any fever or chills.  She denies any chest pain or shortness of breath.  She is otherwise eating and drinking well.  She is also using topical Benadryl.  She has not taken anything orally yet.   Past Medical History:   Past Medical History:  Diagnosis Date  . Colon polyp 05/29/2014   tubular adenoma  . Hypertension     Past Surgical History:   Past Surgical History:  Procedure Laterality Date  . COLONOSCOPY N/A 05/29/2014   Dr. EMERSON Mariner @ [facility] - Adenomatous Polyp, rpt 5 yrs  . Colon @ [facility]  02/16/2023   Colonoscopy unremarkable/PHx CP/Repeat in 10 years/[facility]  . OTHER SURGERY     ovarian cyst    Allergies:   Allergies  Allergen Reactions  . Cetirizine Hcl Other (See Comments)    Throat dryness      Current Medications:   Prior to Admission medications  Medication Sig Taking? Last Dose  atorvastatin (LIPITOR) 20 MG tablet Take 20 mg by mouth once daily Yes Taking  cyanocobalamin (VITAMIN B12) 1000 MCG tablet Take 1,000 mcg by mouth once daily Yes Taking  DULoxetine (CYMBALTA) 30 MG DR capsule Take 30 mg by mouth once daily Yes Taking  gabapentin (NEURONTIN) 100 MG capsule Take 1 capsule (100 mg total) by mouth at bedtime Yes Taking  ibuprofen  200 mg Cap Take by mouth Yes PRN Not Currently Taking  lisinopril (PRINIVIL,ZESTRIL) 10 MG tablet Take 10 mg by mouth once daily. Yes Taking  predniSONE (DELTASONE) 20 MG tablet Take 2  tablets by mouth once daily on days 1-3, then 1 tablet by mouth once daily on days 4-6 Yes   traZODone (DESYREL) 50 MG tablet Take 50 mg by mouth at bedtime Yes Taking  alendronate (FOSAMAX) 70 MG tablet Take 70 mg by mouth every 7 (seven) days Take with a full glass of water. Do not lie down for the next 30 min. Patient not taking: Reported on 04/20/2024  Not Taking  alpha lipoic acid 300 mg capsule Take 1 capsule (300 mg total) by mouth 2 (two) times daily Patient not taking: Reported on 04/20/2024  Not Taking  cetirizine (ZYRTEC) 10 MG tablet Take 10 mg by mouth as needed for Allergies Patient not taking: Reported on 04/20/2024  Not Taking  hydrOXYzine (ATARAX) 25 MG tablet Take 1 tablet (25 mg total) by mouth at bedtime as needed for Itching    predniSONE (DELTASONE) 5 MG tablet Take 2 tablets by mouth once daily on days 7-9, then 1 tablet by mouth once daily on days 10-12    thiamine (VITAMIN B-1) 100 MG tablet Take 100 mg by mouth once daily Patient not taking: Reported on 04/20/2024  Not Taking    Family History:  No family history on file.  Social History:   Social History   Socioeconomic History  . Marital status: Married  Tobacco Use  .  Smoking status: Never  . Smokeless tobacco: Never  Vaping Use  . Vaping status: Never Used  Substance and Sexual Activity  . Alcohol use: Never  . Drug use: Never  . Sexual activity: Defer   Social Drivers of Health   Financial Resource Strain: High Risk (04/20/2024)   Overall Financial Resource Strain (CARDIA)   . Difficulty of Paying Living Expenses: Hard  Food Insecurity: Food Insecurity Present (04/20/2024)   Hunger Vital Sign   . Worried About Programme researcher, broadcasting/film/video in the Last Year: Sometimes true   . Ran Out of Food in the Last Year: Never true  Transportation Needs: No Transportation Needs (04/20/2024)   PRAPARE - Transportation   . Lack of Transportation (Medical): No   . Lack of Transportation (Non-Medical): No  Housing  Stability: Low Risk  (04/20/2024)   Housing Stability Vital Sign   . Unable to Pay for Housing in the Last Year: No   . Number of Times Moved in the Last Year: 0   . Homeless in the Last Year: No    Review of Systems:   Review of Systems  Constitutional:  Negative for chills, fatigue, fever and unexpected weight change.  HENT:  Negative for ear pain, rhinorrhea and sore throat.   Eyes:  Negative for pain and visual disturbance.  Respiratory:  Negative for cough, chest tightness and shortness of breath.   Cardiovascular:  Negative for chest pain, palpitations and leg swelling.  Gastrointestinal:  Negative for abdominal pain, constipation and diarrhea.  Genitourinary:  Negative for frequency and hematuria.  Musculoskeletal:  Negative for arthralgias, joint swelling and myalgias.  Skin:  Positive for rash.  Neurological:  Negative for headaches.  All other systems reviewed and are negative.   Vitals:   Vitals:   04/20/24 1353  BP: 134/70  Pulse: 67  Temp: 37.2 C (98.9 F)  TempSrc: Oral  SpO2: 99%  Weight: 62.2 kg (137 lb 3.2 oz)  Height: 147.3 cm (4' 10)     Body mass index is 28.67 kg/m.  Physical Exam:   Physical Exam Vitals and nursing note reviewed.  Constitutional:      General: She is not in acute distress.    Appearance: She is well-developed. She is not diaphoretic.  HENT:     Head: Normocephalic and atraumatic.     Right Ear: Hearing, tympanic membrane, ear canal and external ear normal.     Left Ear: Hearing, tympanic membrane, ear canal and external ear normal.     Nose: Nose normal.     Mouth/Throat:     Pharynx: Oropharynx is clear. Uvula midline. No oropharyngeal exudate or posterior oropharyngeal erythema.     Tonsils: No tonsillar abscesses.   Eyes:     General: No scleral icterus.       Right eye: No discharge.        Left eye: No discharge.     Conjunctiva/sclera: Conjunctivae normal.     Pupils: Pupils are equal, round, and reactive to  light.    Cardiovascular:     Rate and Rhythm: Normal rate and regular rhythm.     Heart sounds: No murmur heard.    No friction rub. No gallop.  Pulmonary:     Effort: Pulmonary effort is normal. No respiratory distress.     Breath sounds: Normal breath sounds. No wheezing.  Chest:     Chest wall: No tenderness.  Abdominal:     Comments:     Musculoskeletal:  General: Normal range of motion.     Cervical back: Normal range of motion and neck supple.  Lymphadenopathy:     Cervical: No cervical adenopathy.   Skin:    General: Skin is warm and dry.     Findings: Rash present. Rash is vesicular.       Neurological:     Mental Status: She is alert and oriented to person, place, and time.   Psychiatric:        Behavior: Behavior normal.        Thought Content: Thought content normal.        Judgment: Judgment normal.     Assessment and Plan:  No results found for this visit on 04/20/24.  Diagnoses and all orders for this visit:  Food insecurity -     Ambulatory Referral to Centralized Social Support/[employer] Public relations account executive)  Housing or economic circumstance -     Ambulatory Referral to Centralized Social Support/[employer] [phone])  Other orders -     Discontinue: hydrOXYzine (ATARAX) 25 MG tablet; Take 1 tablet (25 mg total) by mouth 3 (three) times daily as needed for Itching -     Discontinue: predniSONE (DELTASONE) 20 MG tablet; Take 2 tablets by mouth once daily on days 1-3, then 1 tablet by mouth once daily on days 4-6 -     Discontinue: predniSONE (DELTASONE) 5 MG tablet; Take 2 tablets by mouth once daily on days 7-9, then 1 tablet by mouth once daily on days 10-12 -     predniSONE (DELTASONE) 20 MG tablet; Take 2 tablets by mouth once daily on days 1-3, then 1 tablet by mouth once daily on days 4-6 -     predniSONE (DELTASONE) 5 MG tablet; Take 2 tablets by mouth once daily on days 7-9, then 1 tablet by mouth once daily on days 10-12 -     hydrOXYzine  (ATARAX) 25 MG tablet; Take 1 tablet (25 mg total) by mouth at bedtime as needed for Itching    Patient Instructions  Take medications as directed. Return to care should your symptoms not improve, or please present to the nearest Emergency Department should your symptoms change or worsen in any way.  START THE PREDNISONE TOMORROW. Taking prednisone too late in the day can affect your sleep.   Portions of this note were created using dictation software and may contain typographical errors.   Patient received an After Visit Summary

## 2024-08-07 ENCOUNTER — Ambulatory Visit: Admitting: Cardiology

## 2024-08-07 DIAGNOSIS — I447 Left bundle-branch block, unspecified: Secondary | ICD-10-CM | POA: Insufficient documentation

## 2024-08-07 NOTE — Progress Notes (Deleted)
  Cardiology Office Note:  .   Date:  08/07/2024  ID:  Carmen Singh, DOB January 27, 1956, MRN [id number] PCP: Carmen Nancie SQUIBB, MD  [facility] Providers Cardiologist:  None { Click to update primary MD,subspecialty MD or APP then REFRESH:1}    No chief complaint on file.   Patient Profile: Carmen Singh is a *** 68 y.o. female non-smoker with a PMH notable for hypertension, and hyperlipidemia who presents here for cardiology evaluation of left bundle branch block at the request of Cronk, Tawni KIDD, MD.  {There is no content from the last Narrative History section.}      Carmen Singh was last seen on August 13 for preventative health visit.  Completely asymptomatic-EKG checked showed left bundle branch block dating back to 2020.  Referred for cardiology evaluation.  She was also referred for OSA CPAP titration  Subjective  Discussed the use of AI scribe software for clinical note transcription with the patient, who gave verbal consent to proceed.  History of Present Illness      Cardiovascular ROS: {roscv:310661}  ROS:  Review of Systems - {ros master:310782}    Objective    She exercises 7 days a week.  Only drinks alcohol on special occasions.  She drinks 2 cups of coffee a day  Studies Reviewed: Carmen        No results found for: CHOL, HDL, LDLCALC, LDLDIRECT, TRIG, CHOLHDL Labs from PCP dated 06/09/2024: Na+ 141, K+ 4.4, Cl- 104, HCO3-22, BUN 20, Cr 0.81, Glu 81, Ca2+ 9.4; CBC: W 6.5, H/H 12.9/40.0, Plt 223  Results    Risk Assessment/Calculations:     No BP recorded.  {Refresh Note OR Click here to enter BP  :1}***        Physical Exam:   VS:  There were no vitals taken for this visit.   Wt Readings from Last 3 Encounters:  02/16/23 131 lb 12.8 oz (59.8 kg)  08/31/22 138 lb (62.6 kg)  06/12/22 133 lb 6.4 oz (60.5 kg)    Physical Exam    GEN: Well nourished, well developed in no acute distress;  *** NECK: No JVD; No carotid bruits CARDIAC: Normal S1, S2; RRR, no murmurs, rubs, gallops RESPIRATORY:  Clear to auscultation without rales, wheezing or rhonchi ; nonlabored, good air movement. ABDOMEN: Soft, non-tender, non-distended EXTREMITIES:  No edema; No deformity      ASSESSMENT AND PLAN: .    Problem List Items Addressed This Visit       Cardiology Problems   Hypertension - Primary   LBBB (left bundle branch block)     Other   OSA (obstructive sleep apnea)    Assessment and Plan Assessment & Plan        {Are you ordering a CV Procedure (e.g. stress test, cath, DCCV, TEE, etc)?   Press F2        :[id number]}   Follow-Up: No follow-ups on file.  I spent *** minutes in the care of [facility] today including {CHL AMB CAR Time Based Billing Options STW (Optional):[id number]::documenting in the encounter.}      Signed, Carmen MICAEL Clay, MD, MS Carmen Singh, M.D., M.S. Interventional Cardiologist  [facility] Pager # [phone]

## 2024-08-08 ENCOUNTER — Ambulatory Visit: Attending: Nurse Practitioner | Admitting: Nurse Practitioner

## 2024-08-08 ENCOUNTER — Encounter: Payer: Self-pay | Admitting: Nurse Practitioner

## 2024-08-08 VITALS — BP 118/70 | HR 59 | Ht 61.0 in | Wt 139.0 lb

## 2024-08-08 DIAGNOSIS — I447 Left bundle-branch block, unspecified: Secondary | ICD-10-CM | POA: Diagnosis not present

## 2024-08-08 DIAGNOSIS — I10 Essential (primary) hypertension: Secondary | ICD-10-CM | POA: Diagnosis not present

## 2024-08-08 DIAGNOSIS — E782 Mixed hyperlipidemia: Secondary | ICD-10-CM | POA: Diagnosis not present

## 2024-08-08 NOTE — Progress Notes (Signed)
 Cardiology Clinic Note   Patient Name: Carmen Singh Date of Encounter: 08/08/2024  Primary Care Provider:  Geofm Nancie SQUIBB, MD Primary Cardiologist:  Alm Clay, MD - new  Cardiology APP:  Vivienne Lonni Ingle, NP   Patient Profile    68 y.o. female with a history of hypertension, hyperlipidemia, left bundle branch block (first noted 2020), obstructive sleep apnea on CPAP, and osteoporosis, who presents to establish care in the setting of left bundle branch block.  Past Medical History    Past Medical History:  Diagnosis Date   HLD (hyperlipidemia)    Hypertension    LBBB (left bundle branch block)    OSA (obstructive sleep apnea)    Osteoporosis    Pre-diabetes    Past Surgical History:  Procedure Laterality Date   COLONOSCOPY     COLONOSCOPY WITH PROPOFOL  N/A 02/16/2023   Procedure: COLONOSCOPY WITH PROPOFOL ;  Surgeon: Maryruth Ole DASEN, MD;  Location: [facility] ENDOSCOPY;  Service: Endoscopy;  Laterality: N/A;  SPANISH INTERPRETER   hysterectomy with left oopherectomy     Age 29 - in [place]    Allergies  Allergies  Allergen Reactions   Gabapentin Other (See Comments)    Other Reaction(s): Dizziness to 100 qhs   Cetirizine Hcl Other (See Comments)    Throat dryness    History of Present Illness    68 y.o. y/o female with a history of hypertension, hyperlipidemia, left bundle branch block, obstructive sleep apnea on CPAP, and osteoporosis.  Patient has never smoked.  As a child, she was often treated for bronchitis and potentially asthma, as she says that she experience wheezing frequently but essentially grew out of this as an adult.  She has no personal prior cardiac history.  Her 100 year old brother recently died of a heart attack.  A maternal uncle died in his 67s of heart attack as well.  She is originally from [place]  She lives locally with her husband and daughter.  She is active in and  around her home, noting that she is always moving, but does not routinely exercise.  She walks her dog regularly, usually without limitation but will become short of breath if she has to run after him.  Patient recently established care at [facility] in mid August 2025 with an ECG that day showing a left bundle branch block.  ECG interpretation from [facility] in July 2020 was the first on record to show a LBBB with a QRS duration of 124 ms.  Patient denies any prior history of chest pain/pressure.  She has some degree of dyspnea on exertion at higher levels of activity (like chasing her dog) but has not noted any significant change in activity tolerance with usual activities.  She denies palpitations, PND, orthopnea, dizziness, syncope, edema, or early satiety.  Objective   Home Medications    Current Outpatient Medications  Medication Sig Dispense Refill   alendronate (FOSAMAX) 70 MG tablet Take 70 mg by mouth once a week.     Alpha-Lipoic Acid 300 MG CAPS Take by mouth.     atorvastatin (LIPITOR) 20 MG tablet Take 20 mg by mouth daily.     BIOTIN PO Take by mouth daily.     cetirizine (ZYRTEC) 5 MG tablet Take 5 mg by mouth daily.     cyanocobalamin (VITAMIN B12) 1000 MCG tablet Take 1,000 mcg by mouth.     DULoxetine (CYMBALTA) 30 MG  capsule Take 30 mg by mouth.     lisinopril (ZESTRIL) 20 MG tablet Take 20 mg by mouth daily.     traZODone (DESYREL) 50 MG tablet Take 50 mg by mouth at bedtime.     albuterol (VENTOLIN HFA) 108 (90 Base) MCG/ACT inhaler Inhale into the lungs. (Patient not taking: Reported on 08/08/2024)     fluticasone (FLONASE) 50 MCG/ACT nasal spray Place into both nostrils. (Patient not taking: Reported on 08/08/2024)     LEXAPRO 5 MG tablet Take 5 mg by mouth daily. (Patient not taking: Reported on 08/08/2024)     melatonin 3 MG TABS tablet Take 3-6 mg by mouth at bedtime. (Patient not taking: Reported on 08/08/2024)     No current facility-administered medications  for this visit.     Family History    Family History  Problem Relation Age of Onset   Uterine cancer Mother    Heart disease Father        died @ age 67   Heart attack Brother        died @ age 62   Heart attack Maternal Uncle    She indicated that her mother is deceased. She indicated that her father is deceased. She indicated that three of her four brothers are alive. She indicated that her maternal uncle is deceased.   Social History    Social History   Socioeconomic History   Marital status: Single    Spouse name: Not on file   Number of children: Not on file   Years of education: Not on file   Highest education level: Not on file  Occupational History   Not on file  Tobacco Use   Smoking status: Never   Smokeless tobacco: Never  Vaping Use   Vaping status: Never Used  Substance and Sexual Activity   Alcohol use: Not Currently    Comment: rare drink on holidays   Drug use: Never   Sexual activity: Not on file  Other Topics Concern   Not on file  Social History Narrative   Originally from [place].  [place].  Lives locally with daughter and husband.  Active in and around her home but does not routinely exercise.   Social Drivers of Corporate investment banker Strain: High Risk (04/20/2024)   Received from [facility]   Overall Financial Resource Strain (CARDIA)    Difficulty of Paying Living Expenses: Hard  Food Insecurity: Food Insecurity Present (04/20/2024)   [facility]   Hunger Vital Sign    Within the past 12 months, you worried that your food would run out before you got the money to buy more.: Sometimes true    Within the past 12 months, the food you bought just didn't last and you didn't have money to get more.: Never true  Transportation Needs: No Transportation Needs (04/20/2024)   Received from [facility] - Transportation    In the past 12  months, has lack of transportation kept you from medical appointments or from getting medications?: No    Lack of Transportation (Non-Medical): No  Physical Activity: Not on file  Stress: Not on file  Social Connections: Not on file  Intimate Partner Violence: Not on file     Review of Systems    General:  No chills, fever, night sweats or weight changes.  Cardiovascular:  No chest pain, +++ dyspnea on exertion at  higher levels of activity, no edema, orthopnea, palpitations, paroxysmal nocturnal dyspnea. Dermatological: No rash, lesions/masses Respiratory: No cough, +++ dyspnea on higher levels of activity like when she has to chase her dog. Urologic: No hematuria, dysuria Abdominal:   No nausea, vomiting, diarrhea, bright red blood per rectum, melena, or hematemesis Neurologic:  No visual changes, wkns, changes in mental status. All other systems reviewed and are otherwise negative except as noted above.     Physical Exam    VS:  BP 118/70 (BP Location: Right Arm, Patient Position: Sitting, Cuff Size: Normal)   Pulse (!) 59   Ht 5' 1 (1.549 m)   Wt 139 lb (63 kg)   SpO2 98%   BMI 26.26 kg/m  , BMI Body mass index is 26.26 kg/m.        GEN: Well nourished, well developed, in no acute distress. HEENT: normal. Neck: Supple, no JVD, carotid bruits, or masses. Cardiac: RRR, no murmurs, rubs, or gallops. No clubbing, cyanosis, edema.  Radials 2+/PT 2+ and equal bilaterally.  Respiratory:  Respirations regular and unlabored, clear to auscultation bilaterally. GI: Soft, nontender, nondistended, BS + x 4. MS: no deformity or atrophy. Skin: warm and dry, no rash. Neuro:  Strength and sensation are intact. Psych: Normal affect.  Accessory Clinical Findings    ECG personally reviewed by me today- EKG Interpretation Date/Time:  Friday August 08 2024 15:29:19 EDT Ventricular Rate:  59 PR Interval:  142 QRS Duration:  128 QT Interval:  434 QTC Calculation: 429 R  Axis:   -24  Text Interpretation: Sinus bradycardia Left bundle branch block Confirmed by Vivienne Bruckner [phone]) on 08/08/2024 3:38:15 PM  - No acute changes  Labs dated June 09, 2024 from Care Everywhere:  Hemoglobin 12.9, hematocrit 40, WBC 6.5, platelets 223 Sodium 141, potassium 4.4, chloride 104, CO2 22, BUN 20, creatinine 0.81, glucose 81     Assessment & Plan   1.  Left bundle branch block: Patient referred for evaluation in the setting of left bundle branch block recently noted on ECG in August 2025.  Review of records shows prior history of left bundle branch block with QRS of 124 ms noted in July 2020 at [facility].  With the exception of higher levels of activity such as chasing her dog, patient is asymptomatic and does not experience chest pain or dyspnea.  She is generally fairly active without recent change in activity tolerance.  We agreed to obtain 2D echocardiogram to rule out structural abnormalities and if present, would then consider ischemic testing.  With risk factors of hypertension and hyperlipidemia as well as some family history of coronary disease, we did discuss potential addition of low-dose aspirin however, patient wishes to defer.  2.  Primary hypertension: Blood pressure is well-controlled on ACE inhibitor therapy.  3.  Hyperlipidemia: On atorvastatin 20 mg daily.  Followed by primary care.  4.  Disposition: Follow-up 2D echocardiogram with office follow-up shortly thereafter.  Bruckner Vivienne, NP 08/08/2024, 5:22 PM

## 2024-08-08 NOTE — Patient Instructions (Signed)
 Medication Instructions:   Your physician recommends that you continue on your current medications as directed. Please refer to the Current Medication list given to you today.    *If you need a refill on your cardiac medications before your next appointment, please call your pharmacy*  Lab Work:  None ordered at this time   If you have labs (blood work) drawn today and your tests are completely normal, you will receive your results only by:  MyChart Message (if you have MyChart) OR  A paper copy in the mail If you have any lab test that is abnormal or we need to change your treatment, we will call you to review the results.  Testing/Procedures:  Your physician has requested that you have an echocardiogram. Echocardiography is a painless test that uses sound waves to create images of your heart. It provides your doctor with information about the size and shape of your heart and how well your heart's chambers and valves are working.   You may receive an ultrasound enhancing agent through an IV if needed to better visualize your heart during the echo. This procedure takes approximately one hour.  There are no restrictions for this procedure.  This will take place at [address] [facility] [address], [place] [address]  Please note: We ask at that you not bring children with you during ultrasound (echo/ vascular) testing. Due to room size and safety concerns, children are not allowed in the ultrasound rooms during exams. Our front office staff cannot provide observation of children in our lobby area while testing is being conducted. An adult accompanying a patient to their appointment will only be allowed in the ultrasound room at the discretion of the ultrasound technician under special circumstances. We apologize for any inconvenience.   Referrals:  None ordered at this time   Follow-Up:  At [facility], you and your health needs are our priority.  As part of  our continuing mission to provide you with exceptional heart care, our providers are all part of one team.  This team includes your primary Cardiologist (physician) and Advanced Practice Providers or APPs (Physician Assistants and Nurse Practitioners) who all work together to provide you with the care you need, when you need it.  Your next appointment:   3 month(s)  Provider:    Alm Clay, MD    We recommend signing up for the patient portal called MyChart.  Sign up information is provided on this After Visit Summary.  MyChart is used to connect with patients for Virtual Visits (Telemedicine).  Patients are able to view lab/test results, encounter notes, upcoming appointments, etc.  Non-urgent messages can be sent to your provider as well.   To learn more about what you can do with MyChart, go to [email or web address].

## 2024-08-27 ENCOUNTER — Ambulatory Visit

## 2024-09-18 ENCOUNTER — Ambulatory Visit: Payer: Self-pay | Admitting: Nurse Practitioner

## 2024-09-18 ENCOUNTER — Ambulatory Visit: Attending: Nurse Practitioner

## 2024-09-18 ENCOUNTER — Other Ambulatory Visit: Payer: Self-pay | Admitting: Nurse Practitioner

## 2024-09-18 DIAGNOSIS — I447 Left bundle-branch block, unspecified: Secondary | ICD-10-CM

## 2024-09-18 DIAGNOSIS — E782 Mixed hyperlipidemia: Secondary | ICD-10-CM

## 2024-09-18 DIAGNOSIS — I10 Essential (primary) hypertension: Secondary | ICD-10-CM

## 2024-09-18 LAB — ECHOCARDIOGRAM COMPLETE
AR max vel: 2.15 cm2
AV Area VTI: 2.15 cm2
AV Area mean vel: 2.07 cm2
AV Mean grad: 4 mmHg
AV Peak grad: 6.7 mmHg
Ao pk vel: 1.29 m/s
Area-P 1/2: 2.95 cm2
S' Lateral: 3.1 cm

## 2024-10-02 ENCOUNTER — Ambulatory Visit: Attending: Cardiology | Admitting: Cardiology

## 2024-10-02 ENCOUNTER — Encounter: Payer: Self-pay | Admitting: Cardiology

## 2024-10-02 VITALS — BP 136/72 | HR 70 | Resp 19 | Ht 60.0 in | Wt 146.6 lb

## 2024-10-02 DIAGNOSIS — I447 Left bundle-branch block, unspecified: Secondary | ICD-10-CM

## 2024-10-02 DIAGNOSIS — I10 Essential (primary) hypertension: Secondary | ICD-10-CM

## 2024-10-02 DIAGNOSIS — E785 Hyperlipidemia, unspecified: Secondary | ICD-10-CM | POA: Insufficient documentation

## 2024-10-02 NOTE — Progress Notes (Signed)
 Cardiology Office Note:  .   Date:  10/02/2024  ID:  Lidia, Clavijo 1956-06-21, MRN [id number] PCP: Geofm Nancie SQUIBB, MD  [facility] Providers Cardiologist:  Alm Clay, MD Cardiology APP:  Vivienne Lonni Ingle, NP     Chief Complaint  Patient presents with   Follow-up    Here to discuss test results    Patient Profile: SABRA Meade Leafy Miranda is a relatively healthy 68 y.o. native Spanish-speaking/Mexican female with a PMH notable for HTN HLD, OSA on CPAP and longstanding LBBB who presents here for 1-month follow-up to discuss test results at the request of Geofm Nancie SQUIBB, MD.     Meade Leafy Miranda was seen for initial consultation by Lonni Vivienne, NP at the request of Lillian Geofm, MD to evaluate left bundle branch block.  She was completely asymptomatic and let bundle-branch block dates back to 2020.  (EKG from [facility]).  2D echo ordered to evaluate cardiac function and wall motion.  They discussed possibly using aspirin, but she declined.  Subjective  Discussed the use of AI scribe software for clinical note transcription with the patient, who gave verbal consent to proceed.  History of Present Illness Dominque Levandowski is a 68 year old female with left bundle branch block who presents to discuss her echocardiogram results. She was referred by Medford Sayer, NP for evaluation of her left bundle branch block.  She has a history of left bundle branch block since 2020. An echocardiogram on September 18, 2024, showed normal biventricular function with an ejection fraction of 60-65%. No wall motion abnormalities were noted, and diastolic parameters, pulmonary pressures, and right atrial pressures were normal.  She is active around the house and is always on the go.  No routine exercise besides walking her dog regularly.  She is mostly asymptomatic with no chest pain or pressure during routine exertion. However, she experiences mild dyspnea  with higher levels of exertion, such as 'chasing her dog,' which is unchanged. No paroxysmal nocturnal dyspnea, orthopnea, palpitations, or leg swelling.  Her past medical history includes hypertension, hyperlipidemia, and obstructive sleep apnea for which she uses CPAP. Her current medications include lisinopril 20 mg daily and atorvastatin 20 mg daily. During the review of symptoms, she reports no shortness of breath during routine activities or when lying down at night, and no chest discomfort.    Objective   78 year old brother died of heart attack, maternal uncle died in his 16s of heart attack.   Studies Reviewed: SABRA   EKG Interpretation Date/Time:  Thursday October 02 2024 14:38:56 EST Ventricular Rate:  65 PR Interval:  150 QRS Duration:  126 QT Interval:  424 QTC Calculation: 440 R Axis:   -21  Text Interpretation: Normal sinus rhythm Left bundle branch block When compared with ECG of 08-Aug-2024 15:29, No significant change was found Confirmed by Clay Alm ([id number]) on 10/02/2024 3:31:32 PM     Results DIAGNOSTIC Echocardiogram: Normal biventricular function, ejection fraction (EF) 60-65%, no wall motion abnormality, normal diastolic parameters, normal pulmonary pressures, normal right atrial pressures (09/18/2024)  Risk Assessment/Calculations:               Physical Exam:   VS:  BP 136/72 (BP Location: Left Arm, Patient Position: Sitting, Cuff Size: Normal)   Pulse 70   Resp 19   Ht 5' (1.524 m)   Wt 146 lb 9.6 oz (66.5 kg)   BMI 28.63 kg/m    Wt Readings from  Last 3 Encounters:  10/02/24 146 lb 9.6 oz (66.5 kg)  08/08/24 139 lb (63 kg)  02/16/23 131 lb 12.8 oz (59.8 kg)      GEN: Well nourished, well developed in no acute distress; healthy-appearing. NECK: No JVD; No carotid bruits CARDIAC: Normal S1, S2; RRR, no murmurs, rubs, gallops RESPIRATORY:  Clear to auscultation without rales, wheezing or rhonchi ; nonlabored, good air movement. EXTREMITIES:   No edema; No deformity      ASSESSMENT AND PLAN: .    Problem List Items Addressed This Visit       Cardiology Problems   Hyperlipidemia LDL goal <100 (Chronic)   Reportedly is she is taking atorvastatin 20 Miller daily, but as far as I can tell has not had lipids checked recently. Continue to follow-up with PCP.  With family history of CAD and hypertension, would at least recommend LDL less than 100.  Consider Coronary Calcium Scoring for Risk Stratification.      Hypertension (Chronic)   Well-controlled blood pressure on current dose of lisinopril 20 mg daily.      Relevant Orders   EKG 12-Lead (Completed)   LBBB (left bundle branch block) - Primary (Chronic)   Left bundle branch block Chronic left bundle branch block since 2020, stable with normal echocardiogram findings. No intervention needed. - Continue routine EKG monitoring for QRS complex changes. - No cardiology follow-up unless new symptoms of heart failure or QRS widening occurs.            Follow-Up: Return if symptoms worsen or fail to improve, for Followup when necessary, [place] office.     Signed, Alm MICAEL Clay, MD, MS Alm Clay, M.D., M.S. Interventional Cardiologist  [facility] Pager # [phone]

## 2024-10-02 NOTE — Assessment & Plan Note (Signed)
 Reportedly is she is taking atorvastatin 20 Miller daily, but as far as I can tell has not had lipids checked recently. Continue to follow-up with PCP.  With family history of CAD and hypertension, would at least recommend LDL less than 100.  Consider Coronary Calcium Scoring for Risk Stratification.

## 2024-10-02 NOTE — Assessment & Plan Note (Signed)
 Well-controlled blood pressure on current dose of lisinopril 20 mg daily.

## 2024-10-02 NOTE — Patient Instructions (Signed)
 Medication Instructions:   Your physician recommends that you continue on your current medications as directed. Please refer to the Current Medication list given to you today.    *If you need a refill on your cardiac medications before your next appointment, please call your pharmacy*  Lab Work:  None ordered at this time   If you have labs (blood work) drawn today and your tests are completely normal, you will receive your results only by:  MyChart Message (if you have MyChart) OR  A paper copy in the mail If you have any lab test that is abnormal or we need to change your treatment, we will call you to review the results.  Testing/Procedures:  None ordered at this time   Referrals:  None ordered at this time   Follow-Up:  At [facility], you and your health needs are our priority.  As part of our continuing mission to provide you with exceptional heart care, our providers are all part of one team.  This team includes your primary Cardiologist (physician) and Advanced Practice Providers or APPs (Physician Assistants and Nurse Practitioners) who all work together to provide you with the care you need, when you need it.  Your next appointment:    As Needed  Provider:    You may see Alm Clay, MD or one of the following Advanced Practice Providers on your designated Care Team:   Lonni Meager, NP Lesley Maffucci, PA-C Bernardino Bring, PA-C Cadence Combs, PA-C Tylene Lunch, NP Barnie Hila, NP    We recommend signing up for the patient portal called MyChart.  Sign up information is provided on this After Visit Summary.  MyChart is used to connect with patients for Virtual Visits (Telemedicine).  Patients are able to view lab/test results, encounter notes, upcoming appointments, etc.  Non-urgent messages can be sent to your provider as well.   To learn more about what you can do with MyChart, go to [email or web address].

## 2024-10-02 NOTE — Assessment & Plan Note (Signed)
 Left bundle branch block Chronic left bundle branch block since 2020, stable with normal echocardiogram findings. No intervention needed. - Continue routine EKG monitoring for QRS complex changes. - No cardiology follow-up unless new symptoms of heart failure or QRS widening occurs.
